# Patient Record
Sex: Male | Born: 1985 | Race: White | Hispanic: No | Marital: Married | Smoking: Current some day smoker
Health system: Southern US, Community
[De-identification: ages and names within clinical notes are randomized; demographics above are authoritative.]

## PROBLEM LIST (undated history)

## (undated) DIAGNOSIS — J45909 Unspecified asthma, uncomplicated: Secondary | ICD-10-CM

## (undated) HISTORY — DX: Unspecified asthma, uncomplicated: J45.909

---

## 2007-03-20 ENCOUNTER — Emergency Department: Payer: Self-pay | Admitting: Emergency Medicine

## 2016-05-19 ENCOUNTER — Encounter: Payer: Self-pay | Admitting: *Deleted

## 2016-05-19 ENCOUNTER — Emergency Department
Admission: EM | Admit: 2016-05-19 | Discharge: 2016-05-19 | Disposition: A | Discharge status: Home / Self Care | Payer: Self-pay | Attending: Emergency Medicine | Admitting: Emergency Medicine

## 2016-05-19 ENCOUNTER — Emergency Department: Payer: Self-pay

## 2016-05-19 DIAGNOSIS — R079 Chest pain, unspecified: Secondary | ICD-10-CM | POA: Insufficient documentation

## 2016-05-19 DIAGNOSIS — F17200 Nicotine dependence, unspecified, uncomplicated: Secondary | ICD-10-CM | POA: Insufficient documentation

## 2016-05-19 LAB — BASIC METABOLIC PANEL
Anion gap: 6 (ref 5–15)
BUN: 18 mg/dL (ref 6–20)
CALCIUM: 9.4 mg/dL (ref 8.9–10.3)
CO2: 28 mmol/L (ref 22–32)
Chloride: 104 mmol/L (ref 101–111)
Creatinine, Ser: 0.85 mg/dL (ref 0.61–1.24)
GFR calc non Af Amer: >60 mL/min (ref >60)
Glucose, Bld: 122 mg/dL — ABNORMAL HIGH (ref 65–99)
Potassium: 3.6 mmol/L (ref 3.5–5.1)
SODIUM: 138 mmol/L (ref 135–145)

## 2016-05-19 LAB — CBC
HEMATOCRIT: 43.7 % (ref 40.0–52.0)
HEMOGLOBIN: 15.1 g/dL (ref 13.0–18.0)
MCH: 30.2 pg (ref 26.0–34.0)
MCHC: 34.6 g/dL (ref 32.0–36.0)
MCV: 87.3 fL (ref 80.0–100.0)
PLATELETS: 178 10*3/uL (ref 150–440)
RBC: 5 MIL/uL (ref 4.40–5.90)
RDW: 12.8 % (ref 11.5–14.5)
WBC: 7.1 10*3/uL (ref 3.8–10.6)

## 2016-05-19 LAB — TROPONIN I: Troponin I: <0.03 ng/mL (ref <0.03)

## 2016-05-19 NOTE — ED Provider Notes (Signed)
[facility] Emergency Department Provider Note  Time seen: 3:44 PM  I have reviewed the triage vital signs and the nursing notes.   HISTORY  Chief Complaint Chest Pain    HPI Ronnie Beltran is a 30 y.o. male with no past medical history who presents the emergency department with chest pain. According to the patient for the past 4-5 days he has been expressing intermittent chest pain. He describes pain as sudden onset, intermittent located in the center of his chest. States the pain will come on at rest as well as while he was exerting himself. He states approximately 1 month ago he had a similar pain at night which awoke him from his sleep. Denies any leg pain or swelling. Denies any shortness of breath nausea or diaphoresis. Denies any chest pain at this time. But states he had an episode approximately one hour prior to arrival.  History reviewed. No pertinent past medical history.  There are no active problems to display for this patient.   History reviewed. No pertinent surgical history.  Prior to Admission medications   Not on File    No Known Allergies  No family history on file.  Social History Social History  Substance Use Topics  . Smoking status: Current Some Day Smoker  . Smokeless tobacco: Not on file  . Alcohol use Yes    Review of Systems Constitutional: Negative for fever Cardiovascular: Intermittent central chest pain Respiratory: Negative for shortness of breath. Gastrointestinal: Negative for abdominal pain, vomiting  Genitourinary: Negative for dysuria. Musculoskeletal: Negative for back pain. Neurological: Negative for headache 10-point ROS otherwise negative.  ____________________________________________   PHYSICAL EXAM:  VITAL SIGNS: ED Triage Vitals  Enc Vitals Group     BP 05/19/16 1304 129/75     Pulse Rate 05/19/16 1304 78     Resp 05/19/16 1304 20     Temp 05/19/16 1304 98.1 F (36.7 C)     Temp Source  05/19/16 1304 Oral     SpO2 05/19/16 1304 98 %     Weight 05/19/16 1305 200 lb (90.7 kg)     Height 05/19/16 1305 5\' 6"  (1.676 m)     Head Circumference --      Peak Flow --      Pain Score --      Pain Loc --      Pain Edu? --      Excl. in GC? --     Constitutional: Alert and oriented. Well appearing and in no distress. Eyes: Normal exam ENT   Head: Normocephalic and atraumatic.   Mouth/Throat: Mucous membranes are moist. Cardiovascular: Normal rate, regular rhythm. No murmur Respiratory: Normal respiratory effort without tachypnea nor retractions. Breath sounds are clear. Nontender. Gastrointestinal: Soft and nontender. No distention.   Musculoskeletal: Nontender with normal range of motion in all extremities. No lower extremity tenderness or edema. Neurologic:  Normal speech and language. No gross focal neurologic deficits  Skin:  Skin is warm, dry and intact.  Psychiatric: Mood and affect are normal.   ____________________________________________    EKG  EKG reviewed and interpreted by myself shows normal sinus rhythm at 60 bpm, narrow QRS, normal axis, normal intervals, no ST changes. Normal EKG.  ____________________________________________    RADIOLOGY  Chest x-ray is negative  ____________________________________________   INITIAL IMPRESSION / ASSESSMENT AND PLAN / ED COURSE  Pertinent labs & imaging results that were available during my care of the patient were reviewed by me and considered in  my medical decision making (see chart for details).  Patient presents for intermittent chest pain over the past 4 or 5 days. Currently patient appears well, denies any symptoms at this time. Patient's workup is normal. Troponin is negative, blood work within normal limits. EKG is normal. Chest x-ray is negative. Discussed with patient follow-up with a cardiologist for a treadmill stress test as a precaution. The patient is agreeable to plan. Overall the patient  appears very well.  ____________________________________________   FINAL CLINICAL IMPRESSION(S) / ED DIAGNOSES  Chest pain    Minna Antis, MD 05/19/16 1546

## 2016-05-19 NOTE — ED Notes (Signed)
MD at bedside. 

## 2016-05-19 NOTE — ED Notes (Signed)
Pt ambulatory back to room in NAD, RR even and unlabored, color WNL.

## 2016-05-19 NOTE — ED Triage Notes (Signed)
Pt complains of intermittent chest pain for the last month, pt denies any other symptoms

## 2016-05-19 NOTE — Discharge Instructions (Signed)
You have been seen in the emergency department today for chest pain. Your workup has shown normal results including your blood work, ekg, and chest xray. As we discussed please follow-up with your primary care physician in the next 1-2 days for recheck. Return to the emergency department for any further chest pain, trouble breathing, or any other symptom personally concerning to yourself. Please call the number provided for cardiology to arrange a stress test as soon as possible.

## 2016-05-19 NOTE — ED Notes (Signed)
Pt alert and oriented X4, active, cooperative, pt in NAD. RR even and unlabored, color WNL.  Pt informed to return if any life threatening symptoms occur.   

## 2017-02-24 IMAGING — CR DG CHEST 2V
1 series · 2 of 2 positions shown · non-contrast
Comparison: 03/20/2007

CLINICAL DATA: Chest pain

EXAM:
CHEST  2 VIEW

[Series 1: dg chest 2 view · 0.14mm/px · 2 of 2 slices shown]
[im 1/2]
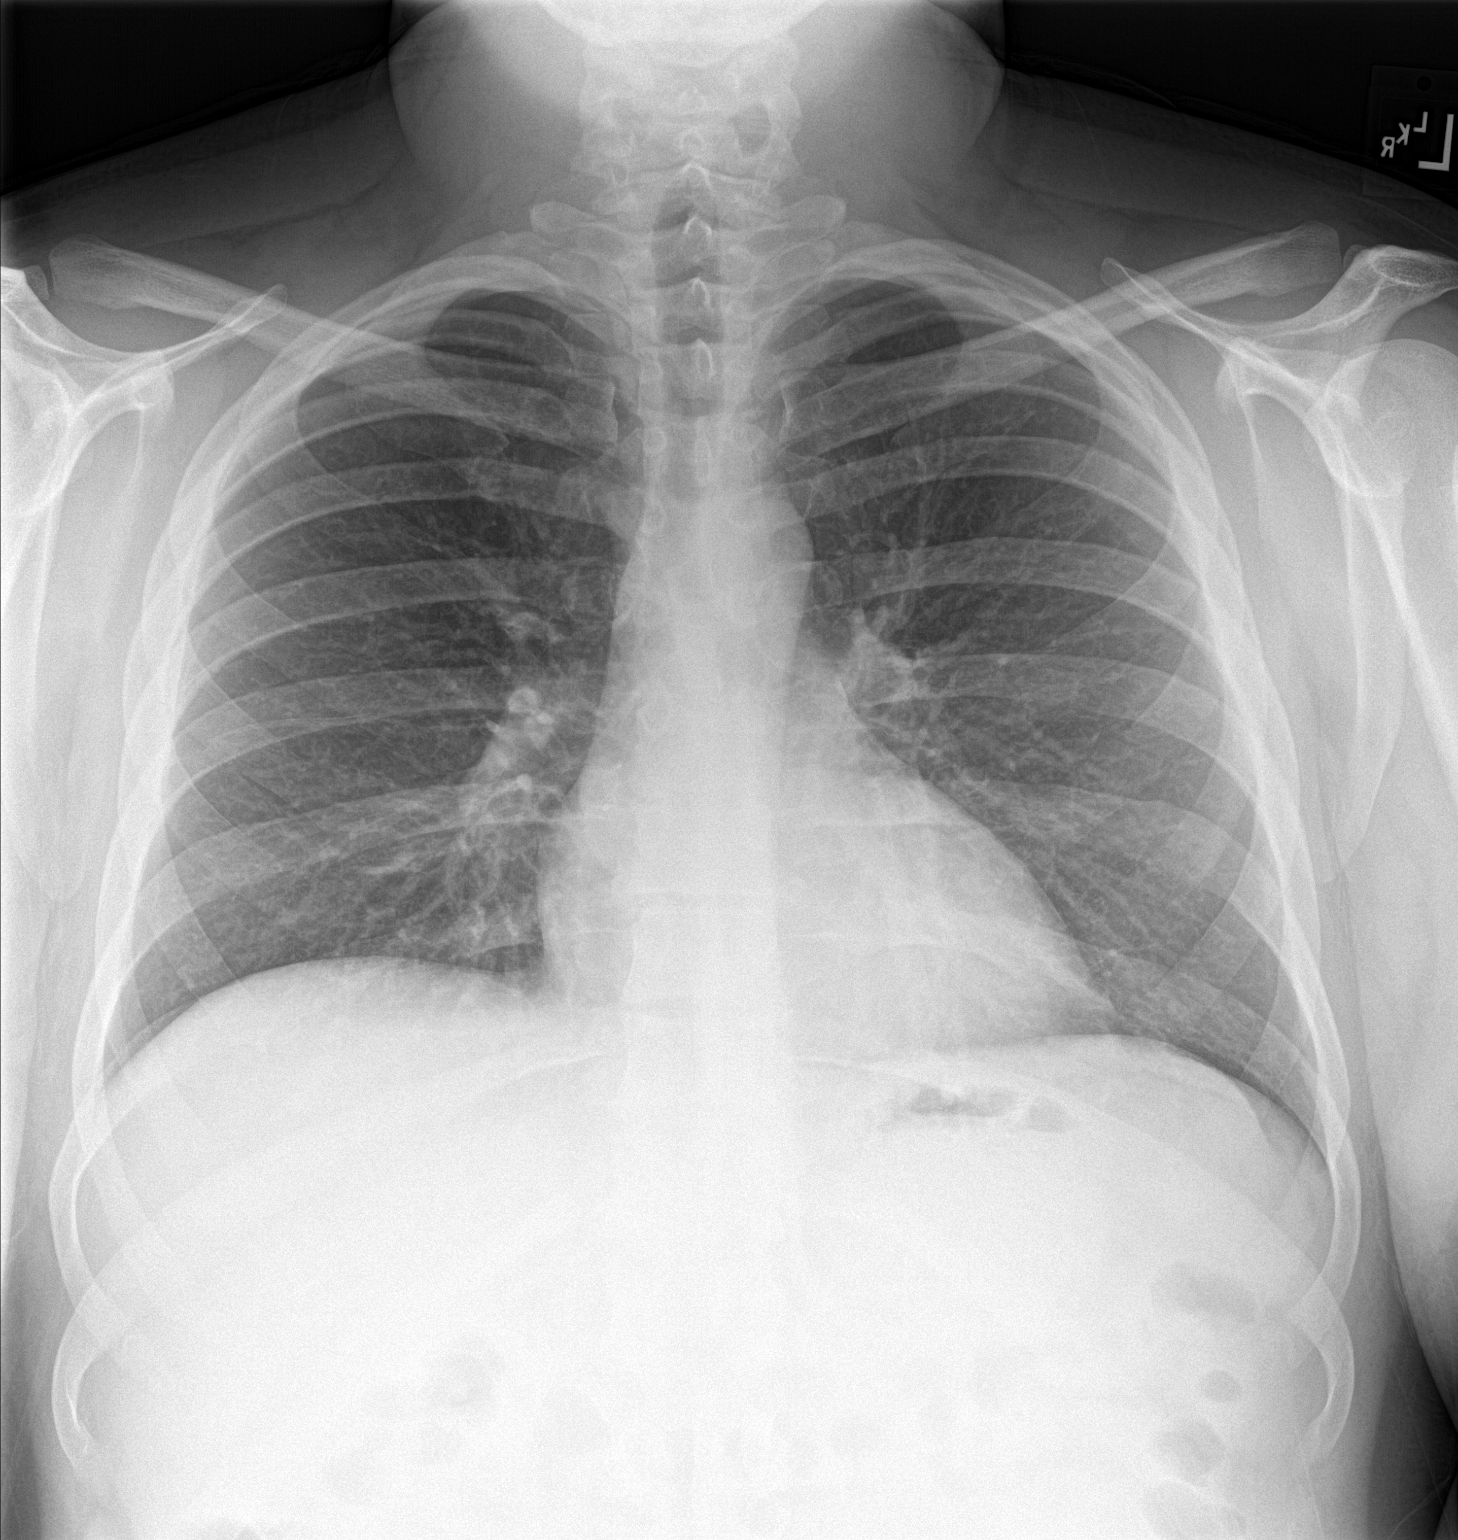
[im 2/2]
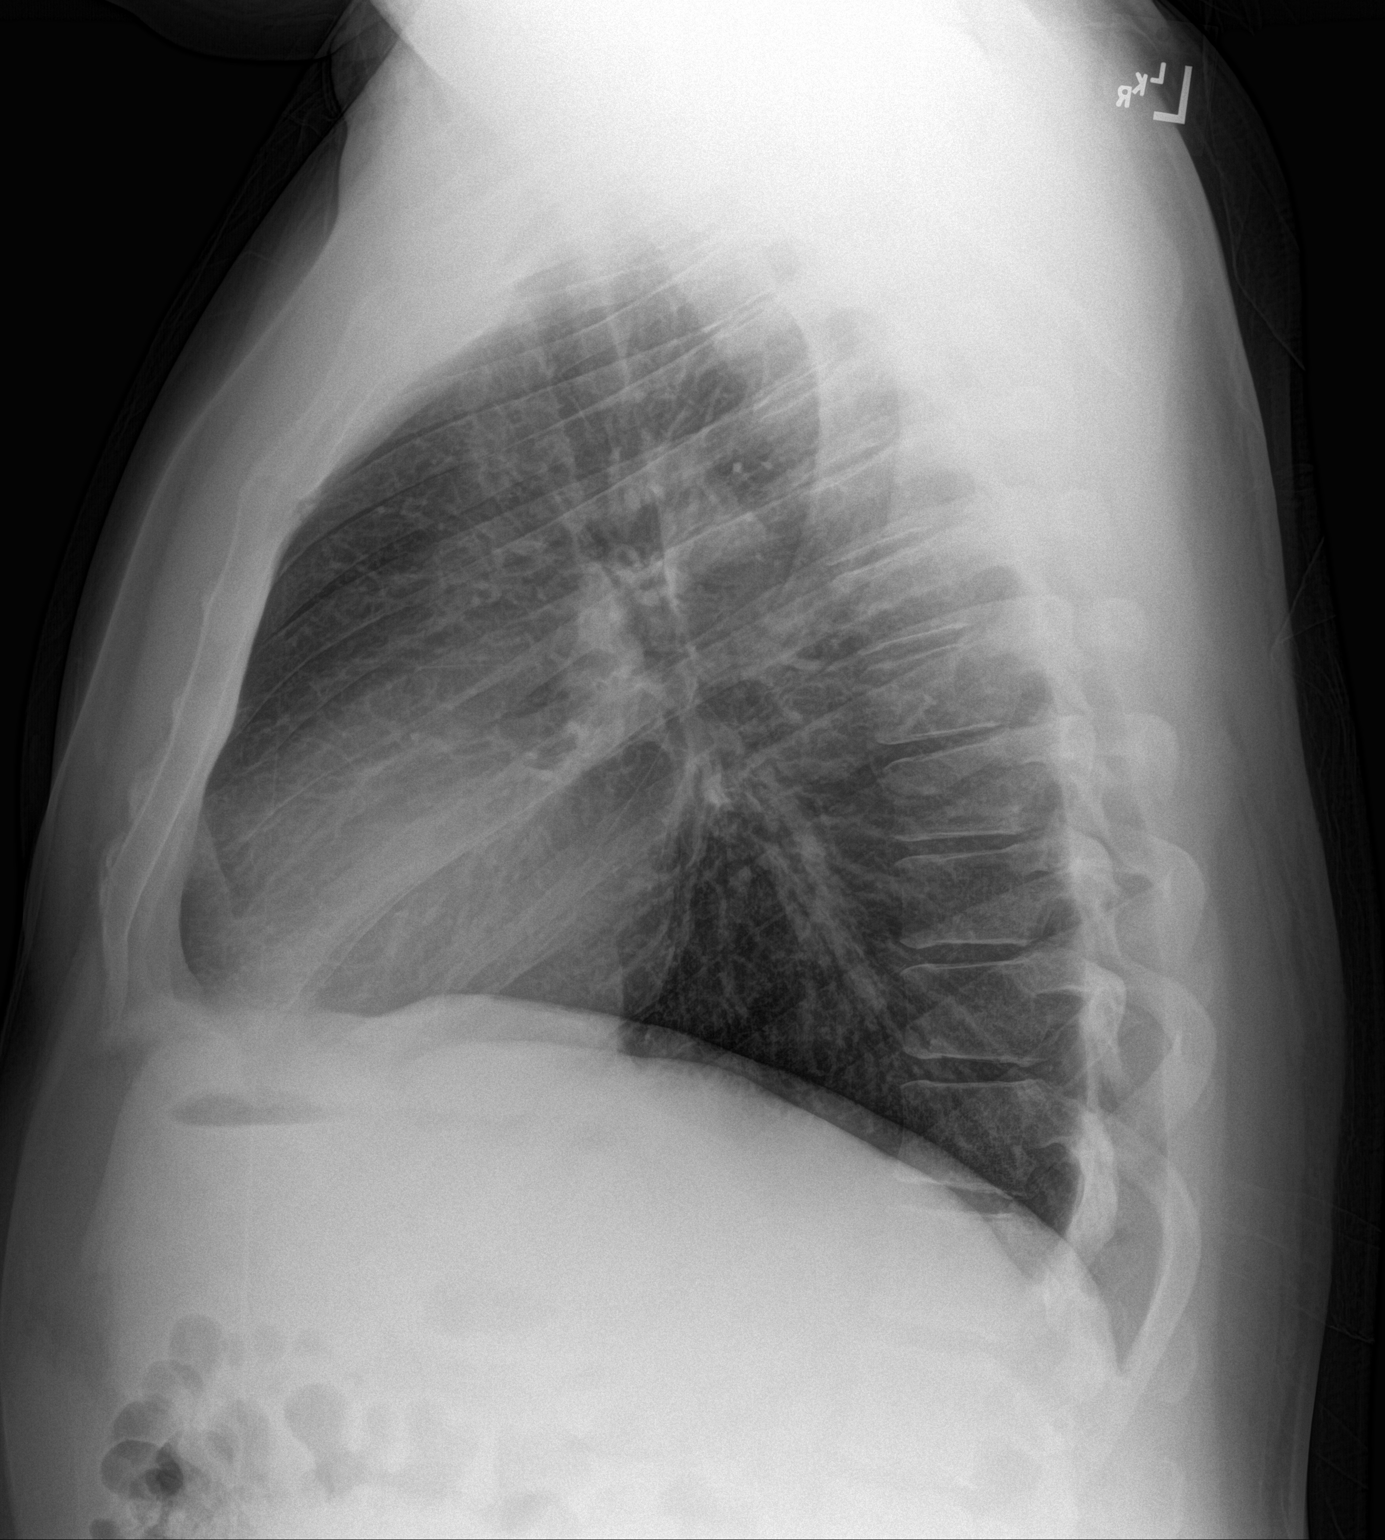

[2 of 2 positions shown; findings below may reference images not displayed]

FINDINGS: The heart size and mediastinal contours are within normal limits.
Both lungs are clear. The visualized skeletal structures are
unremarkable.
IMPRESSION: No active cardiopulmonary disease.

## 2023-06-07 ENCOUNTER — Ambulatory Visit
Admission: EM | Admit: 2023-06-07 | Discharge: 2023-06-07 | Disposition: A | Discharge status: Home / Self Care | Payer: PRIVATE HEALTH INSURANCE | Attending: Physician Assistant | Admitting: Physician Assistant

## 2023-06-07 DIAGNOSIS — R42 Dizziness and giddiness: Secondary | ICD-10-CM | POA: Diagnosis not present

## 2023-06-07 LAB — GLUCOSE, CAPILLARY: Glucose-Capillary: 97 mg/dL (ref 70–99)

## 2023-06-07 NOTE — ED Triage Notes (Signed)
Pt c/o dizziness x3 wks. States he's constantly dizziness. Had most recent episode around 1100 this AM was driving to [place] & had to pull over side of road. Concerned about diabetes. Fam hx of vertigo.

## 2023-06-07 NOTE — ED Provider Notes (Signed)
[facility]    CSN: [id number] Arrival date & time: 06/07/23  1533      History   Chief Complaint Chief Complaint  Patient presents with   Dizziness    HPI Ronnie Beltran is a 37 y.o. male.   Patient reports intermittent episodes of dizziness and shakiness x 2-3 weeks. Symptoms happened today while driving. States he got something sweet today after it happened and symptoms resolved immediately. States he had an episode in the shower where things went black for a second when he closed his eyes the other day. Denies fall or head injury.  He says these episodes are random but he notices it more during the day. He notices symptoms happen after a "long stint of not eating." He says he typically has dinner at 6-7 pm and does not eat a complete meal until lunch at 12 the following day. He will have 2 boiled eggs in the morning. He says he does not eat hardly any carbs. Patient reports weight loss of 40 lbs over the past 3 months. States he eats healthy now and no longer drinks alcohol. Takes Multivitamin but no routine meds.  States he goes to the gym everyday and does not have any issues with dizziness at work.   Patient at at 1 pm last and current fingerstick glucose is 97.   No associated headaches, vision changes, chest pain, palpitations, shortness of breath, abdominal pain, nausea/vomiting.  No sinus issues.  No other complaints.  HPI  History reviewed. No pertinent past medical history.  There are no problems to display for this patient.   History reviewed. No pertinent surgical history.     Home Medications    Prior to Admission medications   Not on File    Family History History reviewed. No pertinent family history.  Social History Social History   Tobacco Use   Smoking status: Some Days  Vaping Use   Vaping status: Never Used  Substance Use Topics   Alcohol use: Yes     Allergies   Patient has no known allergies.   Review of  Systems Review of Systems  Constitutional:  Negative for appetite change, fatigue and fever.  HENT:  Negative for congestion, rhinorrhea, sinus pressure, sinus pain and sore throat.   Respiratory:  Negative for cough and shortness of breath.   Cardiovascular:  Negative for chest pain and palpitations.  Gastrointestinal:  Negative for abdominal pain, nausea and vomiting.  Musculoskeletal:  Negative for myalgias.  Neurological:  Positive for dizziness. Negative for syncope, weakness, numbness and headaches.  Psychiatric/Behavioral:  Negative for confusion.      Physical Exam Triage Vital Signs ED Triage Vitals  Encounter Vitals Group     BP 06/07/23 1630 125/80     Systolic BP Percentile --      Diastolic BP Percentile --      Pulse Rate 06/07/23 1630 70     Resp 06/07/23 1630 16     Temp 06/07/23 1630 (!) 97.4 F (36.3 C)     Temp Source 06/07/23 1630 Oral     SpO2 06/07/23 1630 95 %     Weight 06/07/23 1629 170 lb (77.1 kg)     Height 06/07/23 1629 5\' 6"  (1.676 m)     Head Circumference --      Peak Flow --      Pain Score 06/07/23 1635 0     Pain Loc --      Pain Education --  Exclude from Growth Chart --    No data found.  Updated Vital Signs BP 125/80 (BP Location: Left Arm)   Pulse 70   Temp (!) 97.4 F (36.3 C) (Oral)   Resp 16   Ht 5\' 6"  (1.676 m)   Wt 170 lb (77.1 kg)   SpO2 95%   BMI 27.44 kg/m    Physical Exam Vitals and nursing note reviewed.  Constitutional:      General: He is not in acute distress.    Appearance: Normal appearance. He is well-developed. He is not ill-appearing.  HENT:     Head: Normocephalic and atraumatic.     Right Ear: Tympanic membrane, ear canal and external ear normal.     Left Ear: Tympanic membrane, ear canal and external ear normal.     Nose: Nose normal.     Mouth/Throat:     Mouth: Mucous membranes are moist.     Pharynx: Oropharynx is clear.  Eyes:     General: No scleral icterus.    Extraocular Movements:  Extraocular movements intact.     Conjunctiva/sclera: Conjunctivae normal.     Pupils: Pupils are equal, round, and reactive to light.  Cardiovascular:     Rate and Rhythm: Normal rate and regular rhythm.     Heart sounds: Normal heart sounds.  Pulmonary:     Effort: Pulmonary effort is normal. No respiratory distress.     Breath sounds: Normal breath sounds.  Abdominal:     Palpations: Abdomen is soft.     Tenderness: There is no abdominal tenderness.  Musculoskeletal:     Cervical back: Neck supple.  Skin:    General: Skin is warm and dry.     Capillary Refill: Capillary refill takes less than 2 seconds.  Neurological:     General: No focal deficit present.     Mental Status: He is alert and oriented to person, place, and time. Mental status is at baseline.     Motor: No weakness.     Coordination: Coordination normal.     Gait: Gait normal.  Psychiatric:        Mood and Affect: Mood normal.        Behavior: Behavior normal.      UC Treatments / Results  Labs (all labs ordered are listed, but only abnormal results are displayed) Labs Reviewed  GLUCOSE, CAPILLARY  CBG MONITORING, ED    EKG   Radiology No results found.  Procedures Procedures (including critical care time)  Medications Ordered in UC Medications - No data to display  Initial Impression / Assessment and Plan / UC Course  I have reviewed the triage vital signs and the nursing notes.  Pertinent labs & imaging results that were available during my care of the patient were reviewed by me and considered in my medical decision making (see chart for details).   37 year old male presents for episodes of intermittent dizziness and shakiness during the day after he has not eaten for several hours.  Patient reports significant change to his diet over the past 3 months.  Reports he no longer drinks alcohol and has a very low carb diet and low sugar intake.  Patient says when he does feel dizzy and shaky he  has something sweet and his symptoms resolve right away.  No other associated symptoms.  Vitals are all normal and stable and he is overall well-appearing.  Fingerstick today is 97.  His last meal was about 4 hours ago.  Exam  benign.  Offered to perform lab work and EKG but explained the patient that his symptoms seem consistent with hypoglycemia episodes.  Encouraged him to increase his carbs and sugar a little in his diet.  He says when he has fruit smoothies in the morning he does not have the sort of symptoms.  Encouraged him to increase his fluid intake and rest.  Patient elects to hold off on any further workup at this time.  Advised nursing staff to help him set up a PCP appointment.  ED precautions discussed.   Final Clinical Impressions(s) / UC Diagnoses   Final diagnoses:  Dizziness     Discharge Instructions      -Your dizziness seems likely related to your dietary changes. - As we discussed try to incorporate a little bit more carbs and sugar into your diet especially in the morning.  Consider also having sweets available if you start to feel shaky and dizzy during the day. - Make sure you are drinking plenty of fluids and rest. - Try to set up with a primary care provider.  It is important for you to have yearly labs checked such as A1c for diabetes, cholesterol, blood counts, metabolic panels, etc. - If your dizziness is ever associated with any severe headaches, vision changes, chest pain, racing heart, shortness of breath, weakness you should go to the ER.     ED Prescriptions   None    PDMP not reviewed this encounter.   Shirlee Latch, PA-C 06/07/23 1736

## 2023-06-07 NOTE — Discharge Instructions (Signed)
-  Your dizziness seems likely related to your dietary changes. - As we discussed try to incorporate a little bit more carbs and sugar into your diet especially in the morning.  Consider also having sweets available if you start to feel shaky and dizzy during the day. - Make sure you are drinking plenty of fluids and rest. - Try to set up with a primary care provider.  It is important for you to have yearly labs checked such as A1c for diabetes, cholesterol, blood counts, metabolic panels, etc. - If your dizziness is ever associated with any severe headaches, vision changes, chest pain, racing heart, shortness of breath, weakness you should go to the ER.

## 2023-09-02 NOTE — Progress Notes (Deleted)
   There were no vitals taken for this visit.   Subjective:    Patient ID: Ronnie Beltran, male    DOB: 1986/04/23, 38 y.o.   MRN: [id number]  HPI: Ronnie Beltran is a 38 y.o. male  No chief complaint on file.  Patient presents to clinic to establish care with new PCP.  Introduced to Publishing rights manager role and practice setting.  All questions answered.  Discussed provider/patient relationship and expectations.  Patient reports a history of ***. Patient denies a history of: Hypertension, Elevated Cholesterol, Diabetes, Thyroid problems, Depression, Anxiety, Neurological problems, and Abdominal problems.   Active Ambulatory Problems    Diagnosis Date Noted   No Active Ambulatory Problems   Resolved Ambulatory Problems    Diagnosis Date Noted   No Resolved Ambulatory Problems   No Additional Past Medical History   No past surgical history on file. No family history on file.   Review of Systems  Per HPI unless specifically indicated above     Objective:    There were no vitals taken for this visit.  Wt Readings from Last 3 Encounters:  06/07/23 170 lb (77.1 kg)  05/19/16 200 lb (90.7 kg)    Physical Exam  Results for orders placed or performed during the hospital encounter of 06/07/23  Glucose, capillary   Collection Time: 06/07/23  5:00 PM  Result Value Ref Range   Glucose-Capillary 97 70 - 99 mg/dL      Assessment & Plan:   Problem List Items Addressed This Visit   None    Follow up plan: No follow-ups on file.

## 2023-09-03 ENCOUNTER — Ambulatory Visit: Payer: Commercial Managed Care - PPO | Admitting: Nurse Practitioner

## 2024-01-18 ENCOUNTER — Telehealth: Payer: PRIVATE HEALTH INSURANCE | Admitting: Emergency Medicine

## 2024-01-18 DIAGNOSIS — B9689 Other specified bacterial agents as the cause of diseases classified elsewhere: Secondary | ICD-10-CM

## 2024-01-18 DIAGNOSIS — J019 Acute sinusitis, unspecified: Secondary | ICD-10-CM

## 2024-01-18 MED ORDER — DOXYCYCLINE HYCLATE 100 MG PO TABS: 100.0000 mg | ORAL_TABLET | Freq: Two times a day (BID) | ORAL | 0 refills | Status: AC

## 2024-01-18 NOTE — Progress Notes (Signed)

## 2024-05-25 ENCOUNTER — Ambulatory Visit (INDEPENDENT_AMBULATORY_CARE_PROVIDER_SITE_OTHER): Payer: PRIVATE HEALTH INSURANCE

## 2024-05-25 VITALS — BP 148/80 | HR 70 | Ht 66.0 in | Wt 170.2 lb

## 2024-05-25 DIAGNOSIS — I10 Essential (primary) hypertension: Secondary | ICD-10-CM | POA: Insufficient documentation

## 2024-05-25 DIAGNOSIS — R350 Frequency of micturition: Secondary | ICD-10-CM | POA: Diagnosis not present

## 2024-05-25 NOTE — Progress Notes (Signed)
 New Patient Visit   Physician: Tytus Strahle A Walida Cajas, MD  Patient: Ronnie Beltran   DOB: 1985-10-30   38 y.o. Male  MRN: [id number] Visit Date: 05/25/2024   Chief Complaint  Patient presents with   Establish Care   Subjective  Ronnie Beltran is a 38 y.o. male who presents today as a new patient to establish care.   HPI  Discussed the use of AI scribe software for clinical note transcription with the patient, who gave verbal consent to proceed.  History of Present Illness   Ronnie Beltran is a 38 year old male who presents with increased urinary frequency over the past year.  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  He reports normal BP otherwise in the past.  No CP or palpitations  ASSESSMENT & PLAN  Encounter Diagnoses  Name Primary?   Hypertension, unspecified type Yes   Urinary frequency     Orders Placed This Encounter  Procedures   CBC with Differential/Platelet   Comprehensive metabolic panel with GFR   Hemoglobin A1c   Lipid panel   Urinalysis, Routine w reflex microscopic   PSA   Lipoprotein A (LPA)    Assessment and  Plan    Urinary frequency Urinary frequency worsening over the past year with small volume urination. Normal prostate exam per previous eval. Differential includes hyperactive bladder vs local compression  - Refer to urology for further evaluation and  possible bladder emptying study. - Order PSA test, urinalysis  Foot fracture, healing Foot fracture over a month ago with persistent swelling. Non-adherence to rest may have delayed healing. - Advise to monitor foot for improvement. - Instruct to contact if symptoms worsen or healing does not progress.  General Health Maintenance Elevated blood pressure noted. Family history of heart disease. Regular exercise and healthy diet. Declined flu vaccination. - Order cholesterol panel and genetic marker for cholesterol. - Advise to monitor blood pressure periodically.      F/u in 3-4 weeks to review labs       Objective  BP (!) 148/80   Pulse 70   Ht 5' 6 (1.676 m)   Wt 170 lb 3.2 oz (77.2 kg)   SpO2 93%   BMI 27.47 kg/m      Review of Systems  Constitutional:  Negative for chills, fever and weight loss.  Eyes:  Negative for blurred vision. h Respiratory:  Negative for cough and shortness of breath.   Cardiovascular:  Negative for chest pain and palpitations.  Skin:  Negative for rash.  Psychiatric/Behavioral:  Negative for depression. The patient is not nervous/anxious.      Physical Exam Physical Exam Vitals  reviewed.  Constitutional:      Appearance: Normal appearance. Well-developed with normal weight.  HENT:     Head: Normocephalic and atraumatic.  Normal mucous membranes, no oral lesions Eyes:     Pupils: Pupils are equal, round, and reactive to light.  Neck:     Thyroid: No thyroid mass or thyromegaly.  Cardiovascular:     Rate and Rhythm: Normal rate and regular rhythm. Normal heart sounds. Normal peripheral pulses Pulmonary:     Normal breath sounds with normal effort Abdominal:   Abdomen is soft, without  tenderness or noted hepatosplenomegaly Musculoskeletal:        General: No swelling or edema  Lymphadenopathy:     Cervical: No cervical adenopathy.  Skin:    General: Skin is warm and dry without noticeable rash. Neurological:     General: No focal deficit present.  Psychiatric:        Mood and Affect: Mood, behavior and cognition normal   Past Medical History:  Diagnosis Date   Asthma    History reviewed. No pertinent surgical history. Family Status  Relation Name Status   Mother  Alive   Father  Alive  No partnership data on file   Family History  Problem Relation Age of Onset   Healthy Mother    Healthy Father    Social History   Socioeconomic History   Marital status: Married    Spouse name: Not on file   Number of children: Not on file   Years of education: Not on file   Highest education level: Not on file  Occupational History   Not on file  Tobacco Use   Smoking status: Some Days   Smokeless tobacco: Not on file  Vaping Use   Vaping status: Never Used  Substance and Sexual Activity   Alcohol use: Yes    Comment: socially   Drug use: Not on file   Sexual activity: Not on file  Other Topics Concern   Not on file  Social History Narrative   Not on file   Social Drivers of Health   Financial Resource Strain: Not on file  Food Insecurity: Not on file  Transportation Needs: Not on file  Physical Activity: Not on file  Stress: Not on file  Social Connections: Not on file   No outpatient medications prior to visit.   No facility-administered medications prior to visit.   No Known Allergies   There is no immunization history on file for this patient.  Health Maintenance  Topic Date Due   HIV Screening  Never done   Hepatitis C Screening  Never done   DTaP/Tdap/Td (1 - Tdap) Never done   Pneumococcal Vaccine (1 of 2 - PCV) Never done   Hepatitis B Vaccines 19-59 Average Risk (1 of 3 - 19+ 3-dose series) Never done   HPV VACCINES (1 - 3-dose  SCDM series) Never done   Influenza Vaccine  Never done   COVID-19 Vaccine (1 - 2025-26 season) Never done   Meningococcal B Vaccine  Aged Out    Patient Care Team: Patient, No Pcp Per as PCP - General (General Practice)  Depression Screen    05/25/2024    9:54 AM  PHQ 2/9 Scores  Exception Documentation Patient refusal     Daelin Haste A Tonji Elliff, MD  [facility] [phone] (phone) [phone] (fax)  [facility]

## 2024-05-29 LAB — LIPID PANEL
Cholesterol: 164 mg/dL (ref <200)
HDL: 60 mg/dL (ref >=40)
LDL Cholesterol (Calc): 80 mg/dL
Non-HDL Cholesterol (Calc): 104 mg/dL (ref <130)
Total CHOL/HDL Ratio: 2.7 (calc) (ref <5.0)
Triglycerides: 144 mg/dL (ref <150)

## 2024-05-29 LAB — COMPREHENSIVE METABOLIC PANEL WITH GFR
AG Ratio: 2.2 (calc) (ref 1.0–2.5)
ALT: 21 U/L (ref 9–46)
AST: 42 U/L — ABNORMAL HIGH (ref 10–40)
Albumin: 4.6 g/dL (ref 3.6–5.1)
Alkaline phosphatase (APISO): 62 U/L (ref 36–130)
BUN: 13 mg/dL (ref 7–25)
CO2: 27 mmol/L (ref 20–32)
Calcium: 9.2 mg/dL (ref 8.6–10.3)
Chloride: 105 mmol/L (ref 98–110)
Creat: 0.97 mg/dL (ref 0.60–1.29)
Globulin: 2.1 g/dL (ref 1.9–3.7)
Glucose, Bld: 89 mg/dL (ref 65–99)
Potassium: 4.5 mmol/L (ref 3.5–5.3)
Sodium: 142 mmol/L (ref 135–146)
Total Bilirubin: 0.3 mg/dL (ref 0.2–1.2)
Total Protein: 6.7 g/dL (ref 6.1–8.1)
eGFR: 101 mL/min/1.73m2 (ref >=60)

## 2024-05-29 LAB — CBC WITH DIFFERENTIAL/PLATELET
Absolute Lymphocytes: 1077 {cells}/uL (ref 850–3900)
Absolute Monocytes: 391 {cells}/uL (ref 200–950)
Basophils Absolute: 38 {cells}/uL (ref 0–200)
Basophils Relative: 0.6 %
Eosinophils Absolute: 189 {cells}/uL (ref 15–500)
Eosinophils Relative: 3 %
HCT: 41.6 % (ref 38.5–50.0)
Hemoglobin: 14.2 g/dL (ref 13.2–17.1)
MCH: 30.8 pg (ref 27.0–33.0)
MCHC: 34.1 g/dL (ref 32.0–36.0)
MCV: 90.2 fL (ref 80.0–100.0)
MPV: 9.4 fL (ref 7.5–12.5)
Monocytes Relative: 6.2 %
Neutro Abs: 4605 {cells}/uL (ref 1500–7800)
Neutrophils Relative %: 73.1 %
Platelets: 191 Thousand/uL (ref 140–400)
RBC: 4.61 Million/uL (ref 4.20–5.80)
RDW: 12.4 % (ref 11.0–15.0)
Total Lymphocyte: 17.1 %
WBC: 6.3 Thousand/uL (ref 3.8–10.8)

## 2024-05-29 LAB — URINALYSIS, ROUTINE W REFLEX MICROSCOPIC
Bilirubin Urine: NEGATIVE
Glucose, UA: NEGATIVE
Hgb urine dipstick: NEGATIVE
Ketones, ur: NEGATIVE
Leukocytes,Ua: NEGATIVE
Nitrite: NEGATIVE
Protein, ur: NEGATIVE
Specific Gravity, Urine: 1.015 (ref 1.001–1.035)
pH: 7 (ref 5.0–8.0)

## 2024-05-29 LAB — HEMOGLOBIN A1C
Hgb A1c MFr Bld: 5.1 % (ref <5.7)
Mean Plasma Glucose: 100 mg/dL
eAG (mmol/L): 5.5 mmol/L

## 2024-05-29 LAB — PSA: PSA: 0.5 ng/mL (ref <=4.00)

## 2024-05-29 LAB — LIPOPROTEIN A (LPA): Lipoprotein (a): <10 nmol/L (ref <75)

## 2024-06-05 NOTE — Assessment & Plan Note (Addendum)
 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 Discussed potential etiologies including infectious, inflammatory, bladder-related, benign prostatic hyperplasia (BPH), and musculoskeletal/pelvic floor contributions. Reviewed the standard diagnostic workup (urinalysis, PVR, uroflow, prostate assessment, possible cystoscopy or imaging) and the spectrum of initial management strategies ranging from behavioral and lifestyle measures to pharmacologic therapy, with procedural options if indicated. All questions were addressed and the patient expressed understanding of the evaluation and treatment pathway.   - Recommend attention to daily caffeine intake.  He likely has overactive bladder secondary to significant caffeine.  This is not worrisome, but should be addressed, although I fully expect a decrease in symptom burden with proper moderation -He may follow-up as needed if he does not reach treatment goal in 2-3 months -Other considerations-pelvic floor physical therapy vs empiric Uroxatral

## 2024-06-05 NOTE — Progress Notes (Signed)
   06/14/24 8:29 AM   Katrinka KATHEE Crease 1986-01-29 [id number]   HPI: 38 y.o. male here for initial evaluation of LUTS Urinary frequency x 12 months, voids 10-15x daytime w/ small volumes  Goes to gym every morning Drinks 12 ounce water upon awakening + [employer]els[employer] energy drink + large coffee thermos which he sips on until 11 AM Urinary frequency/urgency is worse in the morning, tapers off by afternoon evening IPSS 19/5 PVR 0 No prior urologic conditions Denies GH, prostatitis, nephrolithiasis, UTIs  Works in holiday representative, self employed  Healthy diet with fruits/vegetables Negative DM screen   PMH: Past Medical History:  Diagnosis Date   Asthma     Surgical History: No past surgical history on file.  Family History: Family History  Problem Relation Age of Onset   Healthy Mother    Healthy Father     Social History:  reports that he has been smoking. He does not have any smokeless tobacco history on file. He reports current alcohol use. No history on file for drug use.      Physical Exam: BP 131/75   Pulse 81   Ht 5' 6 (1.676 m)   Wt 160 lb (72.6 kg)   BMI 25.82 kg/m    Constitutional:  Alert and oriented, No acute distress. Cardiovascular: No clubbing, cyanosis, or edema. Respiratory: Normal respiratory effort, no increased work of breathing. GI: Nondistended Skin: No rashes, bruises or suspicious lesions. Neurologic: Grossly intact, no focal deficits, moving all 4 extremities. Psychiatric: Normal mood and affect.  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 Discussed potential etiologies including infectious, inflammatory, bladder-related, benign prostatic hyperplasia (BPH), and  musculoskeletal/pelvic floor contributions. Reviewed the standard diagnostic workup (urinalysis, PVR, uroflow, prostate assessment, possible cystoscopy or imaging) and the spectrum of initial management strategies ranging from behavioral and lifestyle measures to pharmacologic therapy, with procedural options if indicated. All questions were addressed and the patient expressed understanding of the evaluation and treatment pathway.   - Recommend attention to daily caffeine intake.  He likely has overactive bladder secondary to significant caffeine.  This is not worrisome, but should be addressed, although I fully expect a decrease in symptom burden with proper moderation -He may follow-up as needed if he does not reach treatment goal in 2-3 months -Other considerations-pelvic floor physical therapy vs empiric Uroxatral  Orders: -     Bladder Scan (Post Void Residual) in office      Penne Skye, MD 06/14/2024  [facility] [address], [address] [place], [place] [address] [phone]

## 2024-06-14 ENCOUNTER — Ambulatory Visit (INDEPENDENT_AMBULATORY_CARE_PROVIDER_SITE_OTHER): Payer: PRIVATE HEALTH INSURANCE | Admitting: Urology

## 2024-06-14 ENCOUNTER — Other Ambulatory Visit
Admission: RE | Admit: 2024-06-14 | Discharge: 2024-06-14 | Disposition: A | Discharge status: Home / Self Care | Attending: Urology | Admitting: Urology

## 2024-06-14 ENCOUNTER — Other Ambulatory Visit: Payer: Self-pay

## 2024-06-14 VITALS — BP 131/75 | HR 81 | Ht 66.0 in | Wt 160.0 lb

## 2024-06-14 DIAGNOSIS — R350 Frequency of micturition: Secondary | ICD-10-CM | POA: Diagnosis not present

## 2024-06-14 LAB — URINALYSIS, COMPLETE (UACMP) WITH MICROSCOPIC
Bilirubin Urine: NEGATIVE
Glucose, UA: NEGATIVE mg/dL
Hgb urine dipstick: NEGATIVE
Leukocytes,Ua: NEGATIVE
Nitrite: NEGATIVE
Protein, ur: NEGATIVE mg/dL
Specific Gravity, Urine: 1.025 (ref 1.005–1.030)
Squamous Epithelial / HPF: NONE SEEN /HPF (ref 0–5)
pH: 6 (ref 5.0–8.0)

## 2024-06-14 LAB — BLADDER SCAN AMB NON-IMAGING: Scan Result: 0

## 2024-06-15 ENCOUNTER — Ambulatory Visit: Payer: PRIVATE HEALTH INSURANCE
